# Patient Record
Sex: Female | Born: 1965 | Race: White | Hispanic: No | Smoking: Former smoker
Health system: Southern US, Community
[De-identification: ages and names within clinical notes are randomized; demographics above are authoritative.]

## PROBLEM LIST (undated history)

## (undated) DIAGNOSIS — I10 Essential (primary) hypertension: Secondary | ICD-10-CM

## (undated) DIAGNOSIS — D649 Anemia, unspecified: Secondary | ICD-10-CM

## (undated) DIAGNOSIS — H543 Unqualified visual loss, both eyes: Secondary | ICD-10-CM

## (undated) DIAGNOSIS — T7840XA Allergy, unspecified, initial encounter: Secondary | ICD-10-CM

## (undated) HISTORY — DX: Allergy, unspecified, initial encounter: T78.40XA

## (undated) HISTORY — DX: Anemia, unspecified: D64.9

## (undated) HISTORY — DX: Unqualified visual loss, both eyes: H54.3

## (undated) HISTORY — DX: Essential (primary) hypertension: I10

---

## 2005-09-27 ENCOUNTER — Emergency Department (HOSPITAL_COMMUNITY): Admission: EM | Admit: 2005-09-27 | Discharge: 2005-09-27 | Payer: Self-pay | Admitting: Emergency Medicine

## 2005-11-27 ENCOUNTER — Encounter: Admission: RE | Admit: 2005-11-27 | Discharge: 2005-11-27 | Payer: Self-pay | Admitting: Internal Medicine

## 2008-06-24 ENCOUNTER — Encounter: Admission: RE | Admit: 2008-06-24 | Discharge: 2008-06-24 | Payer: Self-pay | Admitting: Internal Medicine

## 2010-09-30 ENCOUNTER — Encounter: Payer: Self-pay | Admitting: Internal Medicine

## 2011-06-19 ENCOUNTER — Other Ambulatory Visit: Payer: Self-pay | Admitting: Internal Medicine

## 2011-06-19 DIAGNOSIS — Z1231 Encounter for screening mammogram for malignant neoplasm of breast: Secondary | ICD-10-CM

## 2011-07-02 ENCOUNTER — Ambulatory Visit
Admission: RE | Admit: 2011-07-02 | Discharge: 2011-07-02 | Disposition: A | Discharge status: Home / Self Care | Payer: Medicare Other | Source: Ambulatory Visit | Attending: Internal Medicine | Admitting: Internal Medicine

## 2011-07-02 DIAGNOSIS — Z1231 Encounter for screening mammogram for malignant neoplasm of breast: Secondary | ICD-10-CM

## 2012-10-25 IMAGING — MG MM DIGITAL SCREENING BILAT
5 series · 5 of 5 positions shown · non-contrast
Comparison: Prior studies.

DG SCREEN MAMMOGRAM BILATERAL
Bilateral CC and MLO view(s) were taken.

DIGITAL SCREENING MAMMOGRAM WITH CAD:

[R CC]
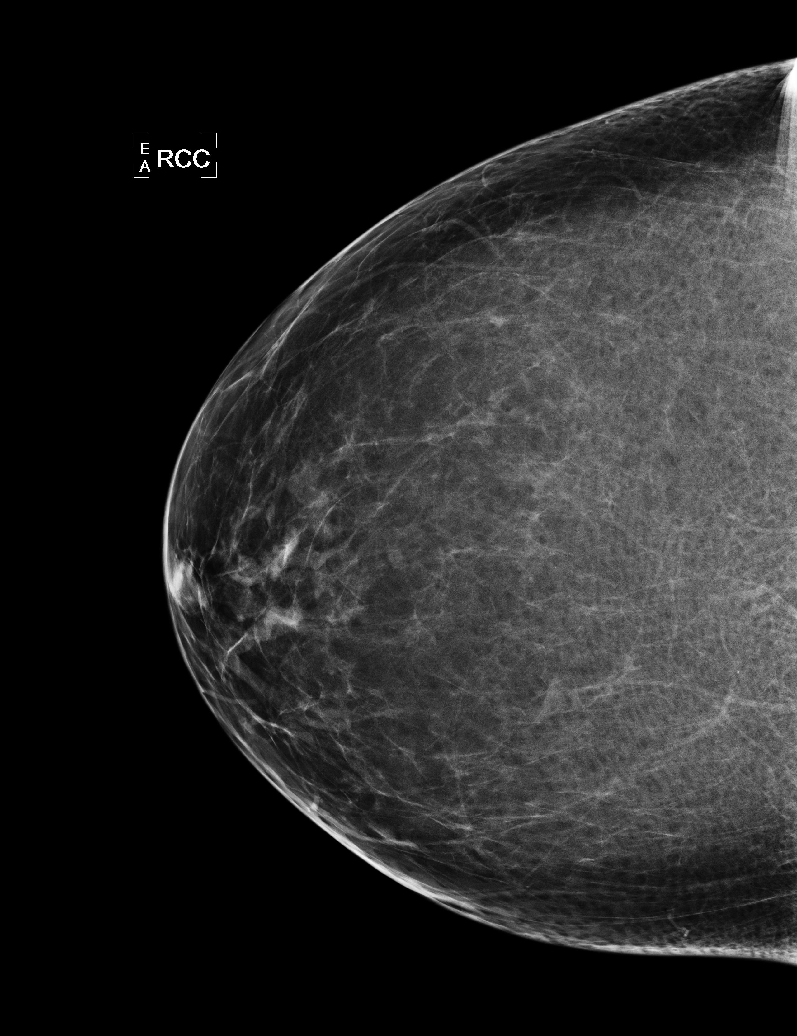

[L CC]
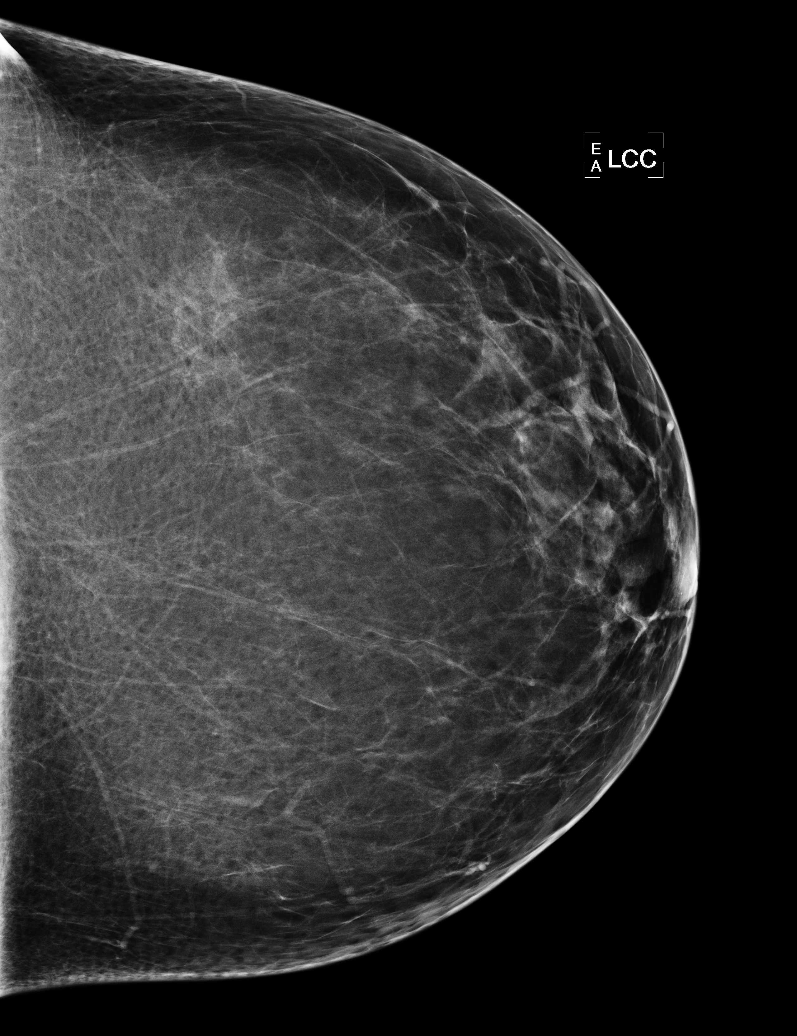

[L MLO]
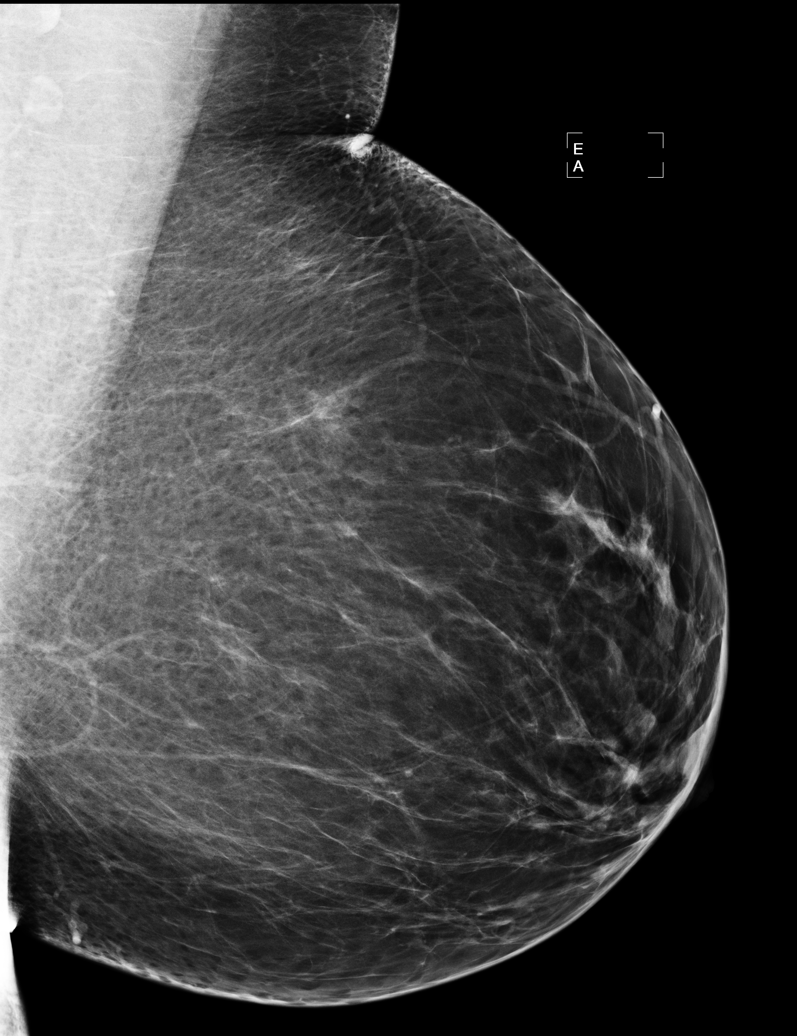

[R MLO (1 of 2)]
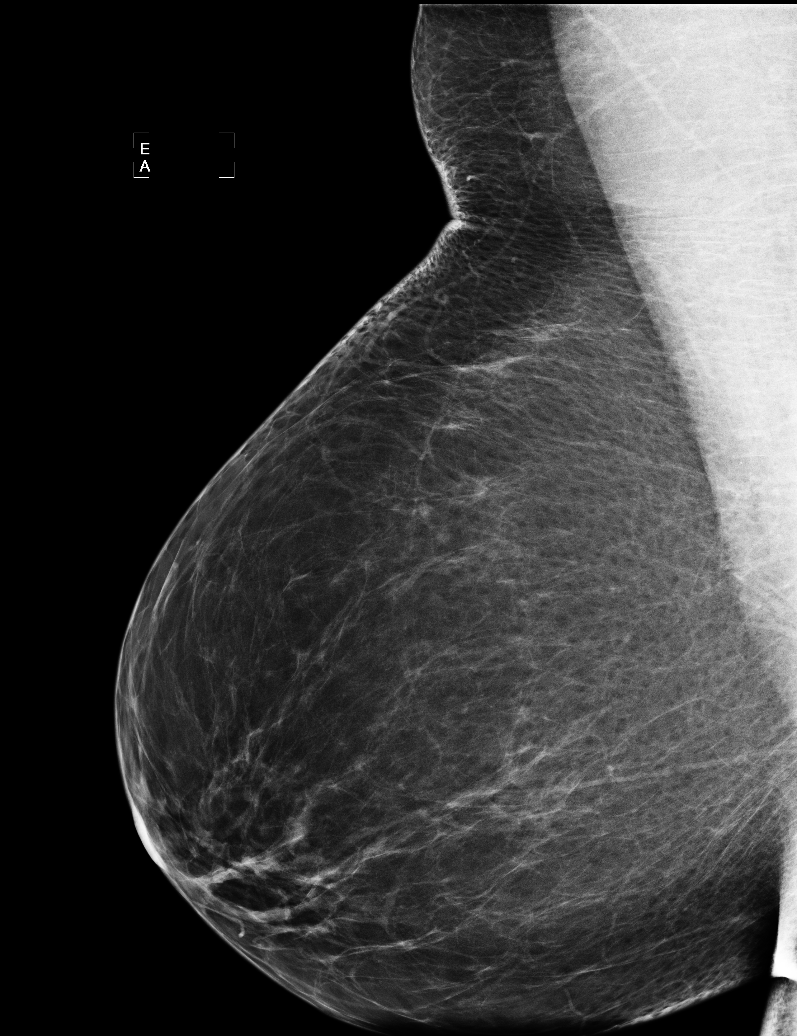

[R MLO (2 of 2)]
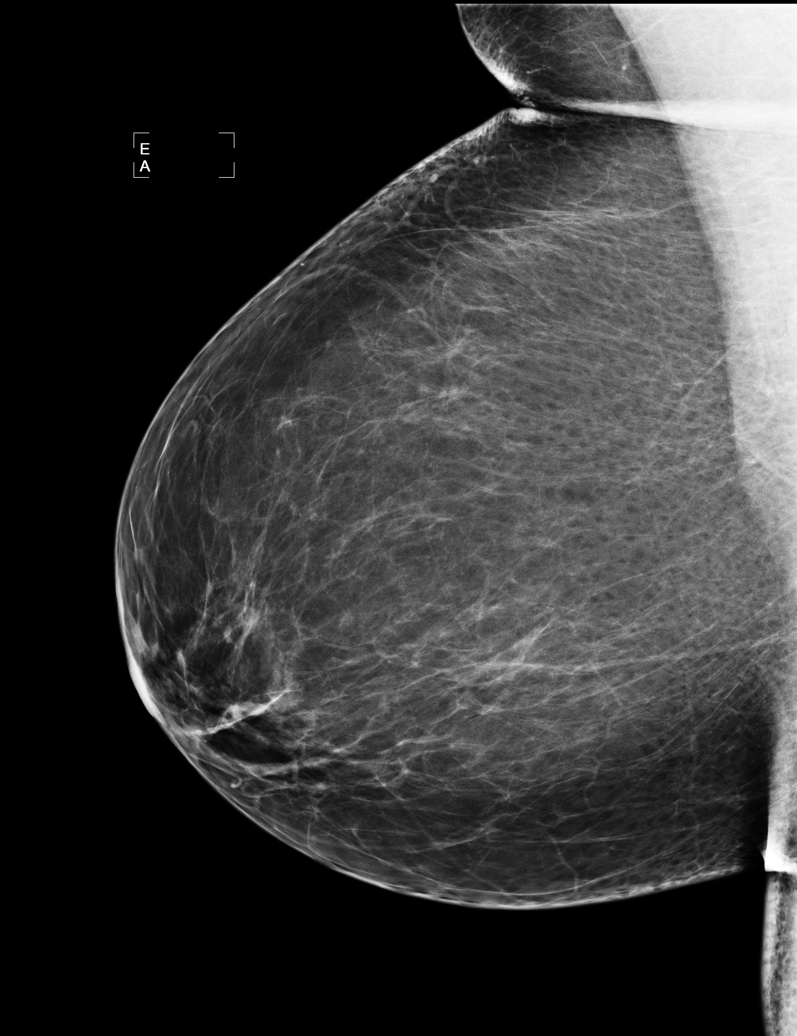

[5 of 5 positions shown; findings below may reference images not displayed]

There are scattered fibroglandular densities.  There is no dominant mass, architectural distortion 
or calcification to suggest malignancy.

Images were processed with CAD.
IMPRESSION: No mammographic evidence of malignancy.  Suggest yearly screening mammography.

A result letter of this screening mammogram will be mailed directly to the patient.

ASSESSMENT: Negative - BI-RADS 1

Screening mammogram in 1 year.
,

## 2013-05-20 ENCOUNTER — Other Ambulatory Visit: Payer: Self-pay | Admitting: Internal Medicine

## 2013-05-20 DIAGNOSIS — Z1231 Encounter for screening mammogram for malignant neoplasm of breast: Secondary | ICD-10-CM

## 2013-06-15 ENCOUNTER — Ambulatory Visit
Admission: RE | Admit: 2013-06-15 | Discharge: 2013-06-15 | Disposition: A | Discharge status: Home / Self Care | Payer: Medicare HMO | Source: Ambulatory Visit | Attending: Internal Medicine | Admitting: Internal Medicine

## 2013-06-15 DIAGNOSIS — Z1231 Encounter for screening mammogram for malignant neoplasm of breast: Secondary | ICD-10-CM

## 2014-10-08 IMAGING — MG MM DIGITAL SCREENING BILAT
4 series · 4 of 4 positions shown · non-contrast
Comparison: Previous exam(s).

CLINICAL DATA: Screening.

DIGITAL SCREENING BILATERAL MAMMOGRAM WITH CAD

[R CC]
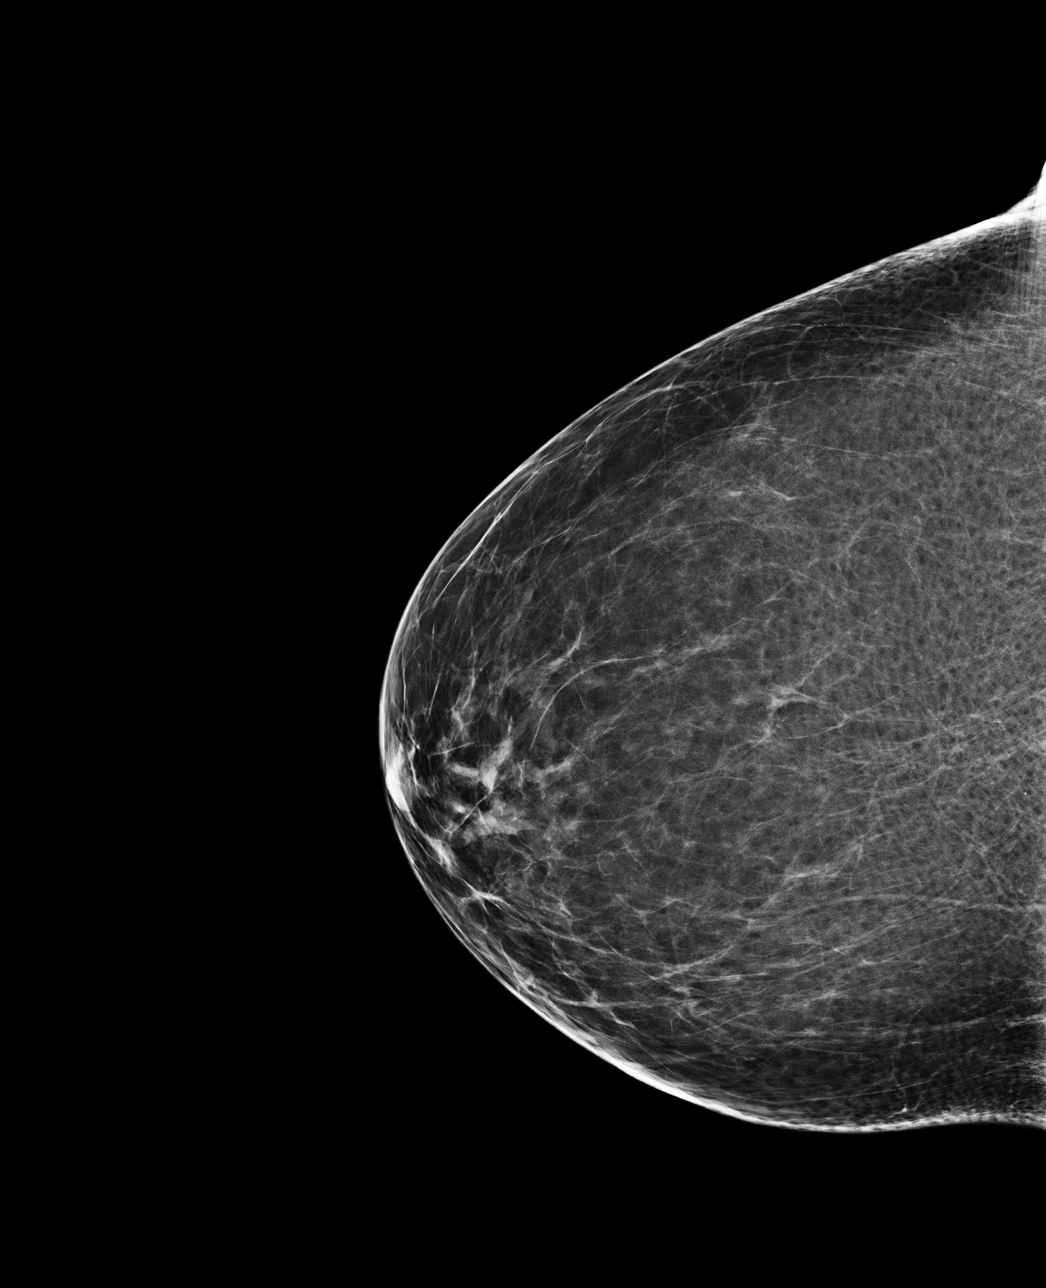

[L CC]
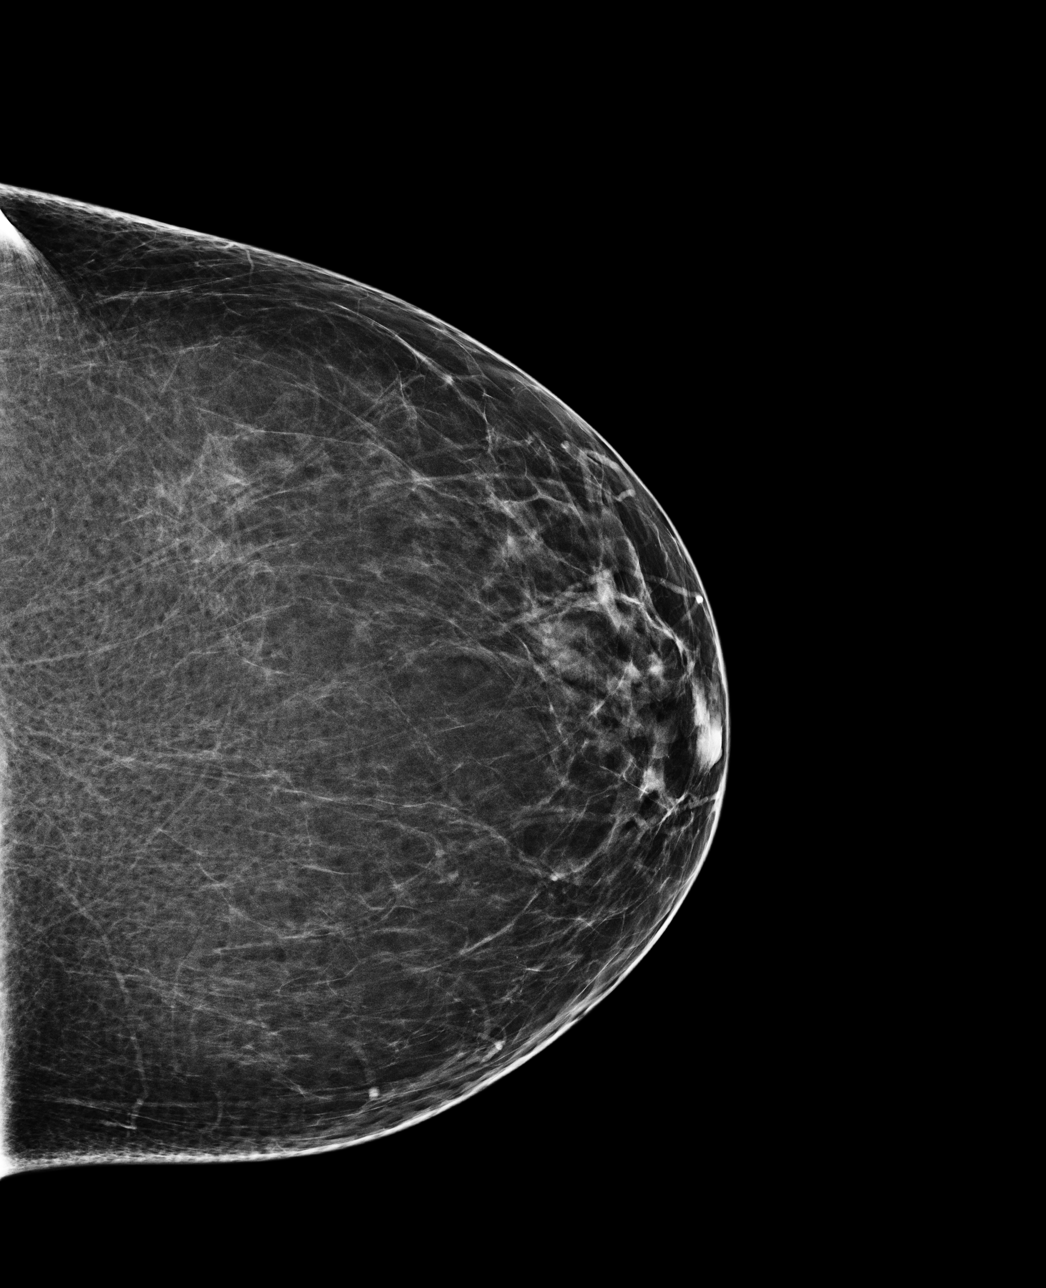

[R MLO]
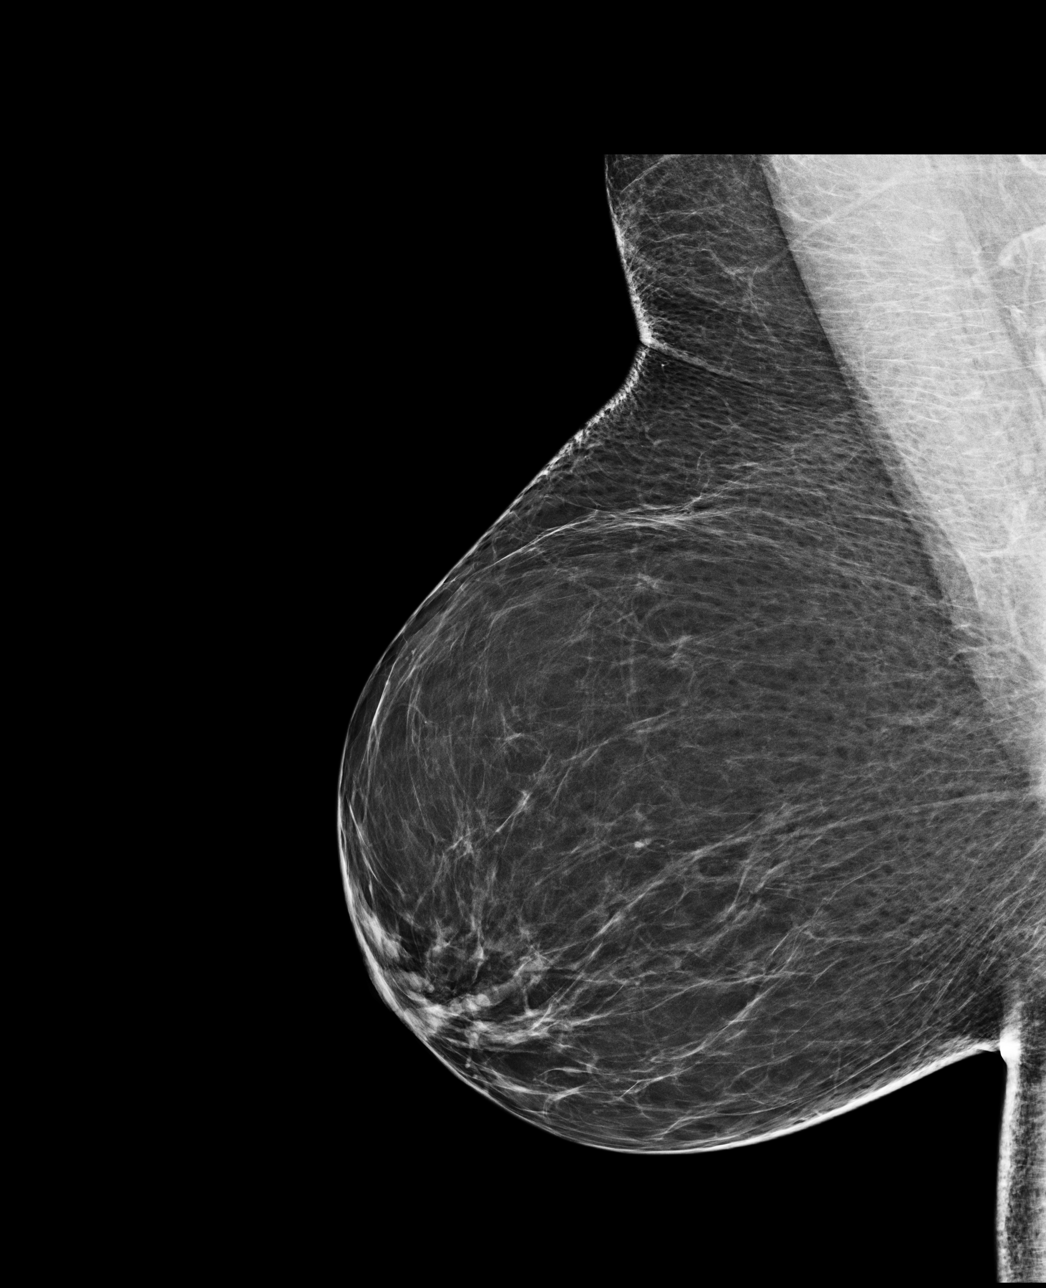

[L MLO]
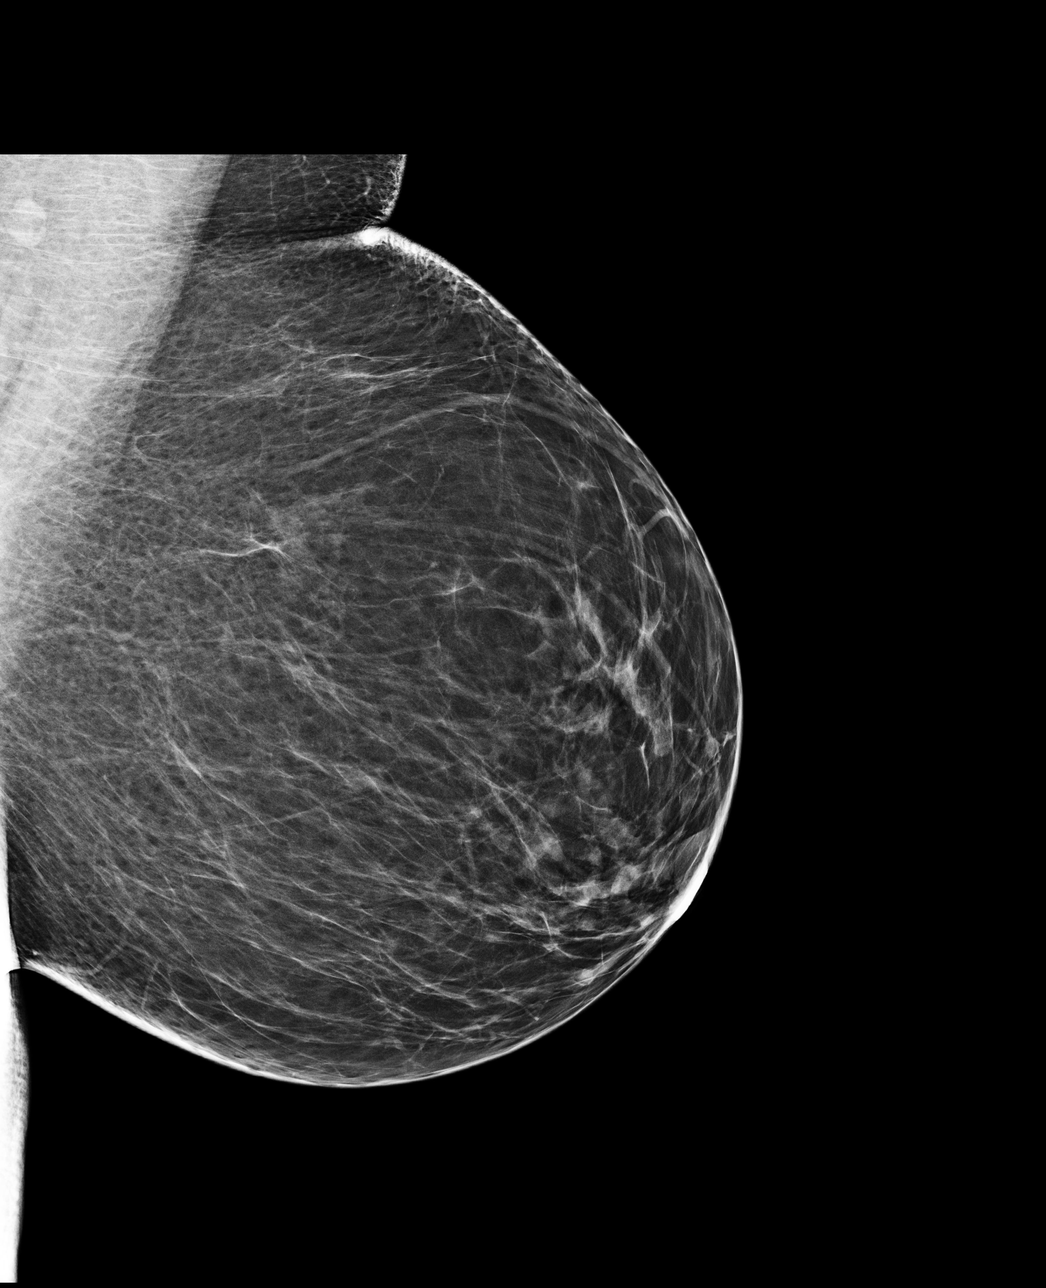

[4 of 4 positions shown; findings below may reference images not displayed]

FINDINGS: ACR Breast Density Category a:  The breast tissue is almost
entirely fatty.

There are no findings suspicious for malignancy.

Images were processed with CAD.
IMPRESSION: No mammographic evidence of malignancy.

A result letter of this screening mammogram will be mailed directly
to the patient.

RECOMMENDATION:
Screening mammogram in one year. (Code:H9-4-W6G)

BI-RADS CATEGORY 1:  Negative.

## 2014-11-01 DIAGNOSIS — Z1211 Encounter for screening for malignant neoplasm of colon: Secondary | ICD-10-CM | POA: Diagnosis not present

## 2014-11-01 DIAGNOSIS — N950 Postmenopausal bleeding: Secondary | ICD-10-CM | POA: Diagnosis not present

## 2014-11-01 DIAGNOSIS — Z01419 Encounter for gynecological examination (general) (routine) without abnormal findings: Secondary | ICD-10-CM | POA: Diagnosis not present

## 2014-11-01 DIAGNOSIS — Z1231 Encounter for screening mammogram for malignant neoplasm of breast: Secondary | ICD-10-CM | POA: Diagnosis not present

## 2014-11-24 DIAGNOSIS — N950 Postmenopausal bleeding: Secondary | ICD-10-CM | POA: Diagnosis not present

## 2014-11-24 DIAGNOSIS — N840 Polyp of corpus uteri: Secondary | ICD-10-CM | POA: Diagnosis not present

## 2014-12-01 ENCOUNTER — Encounter: Payer: Self-pay | Admitting: *Deleted

## 2014-12-12 DIAGNOSIS — N840 Polyp of corpus uteri: Secondary | ICD-10-CM | POA: Diagnosis not present

## 2014-12-12 DIAGNOSIS — N950 Postmenopausal bleeding: Secondary | ICD-10-CM | POA: Diagnosis not present

## 2014-12-27 ENCOUNTER — Ambulatory Visit
Admit: 2014-12-27 | Disposition: A | Discharge status: Home / Self Care | Payer: Self-pay | Attending: Anesthesiology | Admitting: Anesthesiology

## 2014-12-27 DIAGNOSIS — N840 Polyp of corpus uteri: Secondary | ICD-10-CM | POA: Diagnosis not present

## 2014-12-27 DIAGNOSIS — Z87891 Personal history of nicotine dependence: Secondary | ICD-10-CM | POA: Diagnosis not present

## 2014-12-27 DIAGNOSIS — Z01812 Encounter for preprocedural laboratory examination: Secondary | ICD-10-CM | POA: Diagnosis not present

## 2014-12-27 DIAGNOSIS — I10 Essential (primary) hypertension: Secondary | ICD-10-CM | POA: Diagnosis not present

## 2014-12-27 DIAGNOSIS — Z79899 Other long term (current) drug therapy: Secondary | ICD-10-CM | POA: Diagnosis not present

## 2014-12-27 DIAGNOSIS — K219 Gastro-esophageal reflux disease without esophagitis: Secondary | ICD-10-CM | POA: Diagnosis not present

## 2014-12-27 DIAGNOSIS — Z01810 Encounter for preprocedural cardiovascular examination: Secondary | ICD-10-CM | POA: Diagnosis not present

## 2014-12-27 DIAGNOSIS — N950 Postmenopausal bleeding: Secondary | ICD-10-CM | POA: Diagnosis not present

## 2014-12-28 LAB — CBC
HCT: 37.4 % (ref 35.0–47.0)
HGB: 12.6 g/dL (ref 12.0–16.0)
MCH: 29.9 pg (ref 26.0–34.0)
MCHC: 33.6 g/dL (ref 32.0–36.0)
MCV: 89 fL (ref 80–100)
Platelet: 213 10*3/uL (ref 150–440)
RBC: 4.2 10*6/uL (ref 3.80–5.20)
RDW: 13.5 % (ref 11.5–14.5)
WBC: 6.1 10*3/uL (ref 3.6–11.0)

## 2014-12-28 LAB — PROTIME-INR
INR: 0.9
PROTHROMBIN TIME: 12.7 s

## 2014-12-30 ENCOUNTER — Ambulatory Visit
Admit: 2014-12-30 | Disposition: A | Discharge status: Home / Self Care | Payer: Self-pay | Attending: Obstetrics & Gynecology | Admitting: Obstetrics & Gynecology

## 2014-12-30 DIAGNOSIS — I10 Essential (primary) hypertension: Secondary | ICD-10-CM | POA: Diagnosis not present

## 2014-12-30 DIAGNOSIS — H548 Legal blindness, as defined in USA: Secondary | ICD-10-CM | POA: Diagnosis not present

## 2014-12-30 DIAGNOSIS — Z87891 Personal history of nicotine dependence: Secondary | ICD-10-CM | POA: Diagnosis not present

## 2014-12-30 DIAGNOSIS — N950 Postmenopausal bleeding: Secondary | ICD-10-CM | POA: Diagnosis not present

## 2014-12-30 DIAGNOSIS — K219 Gastro-esophageal reflux disease without esophagitis: Secondary | ICD-10-CM | POA: Diagnosis not present

## 2014-12-30 DIAGNOSIS — N840 Polyp of corpus uteri: Secondary | ICD-10-CM | POA: Diagnosis not present

## 2014-12-30 DIAGNOSIS — Z791 Long term (current) use of non-steroidal anti-inflammatories (NSAID): Secondary | ICD-10-CM | POA: Diagnosis not present

## 2014-12-30 DIAGNOSIS — Z79899 Other long term (current) drug therapy: Secondary | ICD-10-CM | POA: Diagnosis not present

## 2014-12-30 DIAGNOSIS — N841 Polyp of cervix uteri: Secondary | ICD-10-CM | POA: Diagnosis not present

## 2014-12-30 DIAGNOSIS — Z87442 Personal history of urinary calculi: Secondary | ICD-10-CM | POA: Diagnosis not present

## 2015-01-02 LAB — SURGICAL PATHOLOGY

## 2015-01-08 NOTE — Op Note (Signed)
PATIENT NAME:  Carolyn Noble, Carolyn Noble MR#:  [id number] DATE OF BIRTH:  04-30-1966  DATE OF PROCEDURE:  12/30/2014  PREOPERATIVE DIAGNOSIS: Postmenopausal bleeding and endometrial polyp.   POSTOPERATIVE DIAGNOSIS: Postmenopausal bleeding and endometrial polyp.   PROCEDURE PERFORMED: Hysteroscopy, dilation and curettage procedure with polypectomy.   SURGEON: Dierdre Searles. Paul Harris, M.D.   ASSISTANT: None.   ANESTHESIA: General.   ESTIMATED BLOOD LOSS: Minimal.   COMPLICATIONS: None.   FINDINGS: Polyp at the fundus of the uterus. No other abnormalities.   DISPOSITION: To the recovery room in stable condition.   TECHNIQUE: The patient is prepped and draped in the usual sterile fashion, after adequate anesthesia is obtained, in the dorsal lithotomy position. Bladder is drained with Noble Robinson catheter. Speculum is placed and the anterior lip of the cervix is grasped with Noble tenaculum. Endocervical curettage is performed with Noble Kevorkian curette. The uterus is sounded to 9 cm. Cervix is dilated to Noble size 7 Hegar dilator. Noble 30 degree hysteroscope is inserted with fluid distention of the intrauterine cavity with the use of lactated Ringer solution, and the above-mentioned findings are visualized. Using the MyoSure device, the polyp is completely excised to Noble clean base was minimal bleeding noted. No other lesions are noted. Hysteroscope is removed with minimal discrepancy of fluid. The cervix is nonbleeding after the tenaculum is removed. The patient goes to the recovery room in stable condition. All sponge, instrument, and needle counts are correct.  ____________________________ R. Annamarie MajorPaul Harris, MD rph:sb D: 12/30/2014 09:30:51 ET T: 12/30/2014 10:41:43 ET JOB#: [id number]  cc: Dierdre Searles. Paul Harris, MD, <Dictator> Nadara MustardOBERT P HARRIS MD ELECTRONICALLY SIGNED 12/30/2014 13:55

## 2015-01-09 ENCOUNTER — Encounter: Payer: Self-pay | Admitting: Family Medicine

## 2015-01-09 ENCOUNTER — Ambulatory Visit (INDEPENDENT_AMBULATORY_CARE_PROVIDER_SITE_OTHER): Payer: Commercial Managed Care - HMO | Admitting: Family Medicine

## 2015-01-09 VITALS — BP 122/78 | HR 78 | Temp 97.6°F | Resp 16 | Ht 62.5 in | Wt 187.0 lb

## 2015-01-09 DIAGNOSIS — I10 Essential (primary) hypertension: Secondary | ICD-10-CM | POA: Diagnosis not present

## 2015-01-09 DIAGNOSIS — M7072 Other bursitis of hip, left hip: Secondary | ICD-10-CM

## 2015-01-09 DIAGNOSIS — Z7189 Other specified counseling: Secondary | ICD-10-CM | POA: Diagnosis not present

## 2015-01-09 DIAGNOSIS — Z7689 Persons encountering health services in other specified circumstances: Secondary | ICD-10-CM

## 2015-01-09 LAB — CBC WITH DIFFERENTIAL/PLATELET
BASOS PCT: 1 % (ref 0–1)
Basophils Absolute: 0.1 10*3/uL (ref 0.0–0.1)
EOS PCT: 2 % (ref 0–5)
Eosinophils Absolute: 0.1 10*3/uL (ref 0.0–0.7)
HCT: 38 % (ref 36.0–46.0)
HEMOGLOBIN: 12.7 g/dL (ref 12.0–15.0)
Lymphocytes Relative: 26 % (ref 12–46)
Lymphs Abs: 1.5 10*3/uL (ref 0.7–4.0)
MCH: 29.1 pg (ref 26.0–34.0)
MCHC: 33.4 g/dL (ref 30.0–36.0)
MCV: 87 fL (ref 78.0–100.0)
MPV: 10.6 fL (ref 8.6–12.4)
Monocytes Absolute: 0.6 10*3/uL (ref 0.1–1.0)
Monocytes Relative: 11 % (ref 3–12)
NEUTROS PCT: 60 % (ref 43–77)
Neutro Abs: 3.5 10*3/uL (ref 1.7–7.7)
Platelets: 215 10*3/uL (ref 150–400)
RBC: 4.37 MIL/uL (ref 3.87–5.11)
RDW: 13.7 % (ref 11.5–15.5)
WBC: 5.8 10*3/uL (ref 4.0–10.5)

## 2015-01-09 LAB — LIPID PANEL
Cholesterol: 201 mg/dL — ABNORMAL HIGH (ref 0–200)
HDL: 51 mg/dL (ref >=46)
LDL Cholesterol: 120 mg/dL — ABNORMAL HIGH (ref 0–99)
TRIGLYCERIDES: 152 mg/dL — AB (ref <150)
Total CHOL/HDL Ratio: 3.9 Ratio
VLDL: 30 mg/dL (ref 0–40)

## 2015-01-09 LAB — COMPLETE METABOLIC PANEL WITH GFR
ALT: 19 U/L (ref 0–35)
AST: 14 U/L (ref 0–37)
Albumin: 4 g/dL (ref 3.5–5.2)
Alkaline Phosphatase: 53 U/L (ref 39–117)
BUN: 16 mg/dL (ref 6–23)
CALCIUM: 9.1 mg/dL (ref 8.4–10.5)
CO2: 21 mEq/L (ref 19–32)
Chloride: 108 mEq/L (ref 96–112)
Creat: 0.88 mg/dL (ref 0.50–1.10)
GFR, EST AFRICAN AMERICAN: 88 mL/min
GFR, Est Non African American: 76 mL/min
Glucose, Bld: 89 mg/dL (ref 70–99)
Potassium: 4.1 mEq/L (ref 3.5–5.3)
Sodium: 141 mEq/L (ref 135–145)
Total Bilirubin: 0.4 mg/dL (ref 0.2–1.2)
Total Protein: 6.5 g/dL (ref 6.0–8.3)

## 2015-01-09 NOTE — Progress Notes (Signed)
Subjective:    Patient ID: Carolyn Noble, female    DOB: 04/23/1966, 49 y.o.   MRN: [id number]  HPI Patient is here today to establish care. She sees a gynecologist. Her mammogram and Pap smear up-to-date. She recently underwent surgery to remove a polyp from her uterus due to postmenopausal bleeding. The pathology is still pending. She also complains of pain in her left lateral hip over the greater trochanter. She is tender to palpation in this area. She has IT band pain. It aches and throbs to sleep on that hip at night. She denies any injury. She is due for colonoscopy. 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 Anemia   . Allergy     SEASONAL  . Hypertension    No past surgical history on file. Current Outpatient Prescriptions on File Prior to Visit  Medication Sig Dispense Refill  . cholecalciferol (VITAMIN D) 1000 UNITS tablet Take 1,000 Units by mouth daily.    Marland Kitchen. lisinopril (PRINIVIL,ZESTRIL) 5 MG tablet Take 5 mg by mouth daily.    Marland Kitchen. loratadine (CLARITIN) 10 MG tablet Take 10 mg by mouth daily.    . meloxicam (MOBIC) 15 MG tablet Take 15 mg by mouth daily.    . potassium chloride (K-DUR,KLOR-CON) 10 MEQ tablet Take 10 mEq by mouth 2 (two) times daily.    . vitamin B-12 (CYANOCOBALAMIN) 100 MCG tablet Take 100 mcg by mouth daily.    Marland Kitchen. zinc sulfate 220 MG capsule Take 220 mg by mouth daily.     No current facility-administered medications on file prior to visit.   Allergies  Allergen Reactions  . Hydrocodone     INTOLERANCE- UPSET STOMACH   History   Social History  . Marital Status: Divorced    Spouse Name: N/A  . Number of Children: N/A  . Years of Education: N/A   Occupational History  . Not on file.   Social History Main Topics  . Smoking status: Never Smoker   . Smokeless tobacco: Never Used  . Alcohol Use: No  . Drug Use: No  . Sexual Activity: Yes    Birth  Control/ Protection: None   Other Topics Concern  . Not on file   Social History Narrative  . No narrative on file   Family History  Problem Relation Age of Onset  . Arthritis Mother   . COPD Mother   . Diabetes Mother   . Diabetes Father   . Heart disease Father   . Vision loss Father   . Heart disease Sister   . Hypertension Sister   . Vision loss Sister   . Hypertension Brother   . Cancer Maternal Grandmother   . Diabetes Maternal Grandmother   . Diabetes Maternal Grandfather   . Cancer Paternal Grandmother   . Diabetes Paternal Grandmother   . Diabetes Paternal Grandfather      Review of Systems  All other systems reviewed and are negative.      Objective:   Physical Exam  Constitutional: She is oriented to person, place, and time. She appears well-developed and well-nourished. No distress.  HENT:  Head: Normocephalic and atraumatic.  Right Ear: External ear normal.  Left Ear: External ear normal.  Nose: Nose normal.  Mouth/Throat: Oropharynx is clear and moist. No oropharyngeal exudate.  Eyes: Conjunctivae and EOM are normal. Pupils are equal, round, and reactive to light. Right eye exhibits  no discharge. Left eye exhibits no discharge. No scleral icterus.  Neck: Normal range of motion. Neck supple. No JVD present. No tracheal deviation present. No thyromegaly present.  Cardiovascular: Normal rate, regular rhythm, normal heart sounds and intact distal pulses.  Exam reveals no gallop and no friction rub.   No murmur heard. Pulmonary/Chest: Effort normal and breath sounds normal. No stridor. No respiratory distress. She has no wheezes. She has no rales. She exhibits no tenderness.  Abdominal: Soft. Bowel sounds are normal. She exhibits no distension and no mass. There is no tenderness. There is no rebound and no guarding.  Musculoskeletal: Normal range of motion. She exhibits no edema or tenderness.  Lymphadenopathy:    She has no cervical adenopathy.    Neurological: She is alert and oriented to person, place, and time. She has normal reflexes. She displays normal reflexes. No cranial nerve deficit. She exhibits normal muscle tone. Coordination normal.  Skin: Skin is warm. No rash noted. She is not diaphoretic. No erythema. No pallor.  Psychiatric: She has a normal mood and affect. Her behavior is normal. Judgment and thought content normal.  Vitals reviewed.         Assessment & Plan:  Establishing care with new doctor, encounter for  Benign essential HTN - Plan: COMPLETE METABOLIC PANEL WITH GFR, CBC with Differential/Platelet, Lipid panel  Hip bursitis, left - Plan: DG HIP UNILAT WITH PELVIS 2-3 VIEWS LEFT  I recommended a colonoscopy and I will gladly schedule that whenever the patient wishes. Her blood pressure is excellent. I will check a CMP, fasting lipid panel, and CBC. I have also recommended an x-ray of the left hip. I recommended meloxicam 15 mg by mouth daily for bursitis and if no better consider a cortisone injection in the left hip. Otherwise preventative care is up-to-date.

## 2015-01-11 ENCOUNTER — Encounter: Payer: Self-pay | Admitting: Family Medicine

## 2015-01-31 ENCOUNTER — Encounter: Payer: Self-pay | Admitting: *Deleted

## 2015-02-09 ENCOUNTER — Other Ambulatory Visit: Payer: Self-pay | Admitting: Family Medicine

## 2015-02-09 NOTE — Telephone Encounter (Signed)
Refill denied.   Patient not under MD.

## 2015-03-01 ENCOUNTER — Other Ambulatory Visit: Payer: Self-pay | Admitting: Family Medicine

## 2015-03-01 NOTE — Telephone Encounter (Signed)
Medication refilled per protocol. 

## 2015-06-30 ENCOUNTER — Ambulatory Visit (INDEPENDENT_AMBULATORY_CARE_PROVIDER_SITE_OTHER): Payer: Commercial Managed Care - HMO | Admitting: Family Medicine

## 2015-06-30 ENCOUNTER — Encounter: Payer: Self-pay | Admitting: Family Medicine

## 2015-06-30 VITALS — BP 110/76 | HR 74 | Temp 98.1°F | Resp 20 | Wt 187.5 lb

## 2015-06-30 DIAGNOSIS — M25562 Pain in left knee: Secondary | ICD-10-CM | POA: Diagnosis not present

## 2015-06-30 MED ORDER — KETOROLAC TROMETHAMINE 10 MG PO TABS: 10.0000 mg | ORAL_TABLET | Freq: Four times a day (QID) | ORAL | Status: DC | PRN

## 2015-06-30 NOTE — Progress Notes (Signed)
Subjective:    Patient ID: Carolyn Noble, female    DOB: 10/09/1965, 49 y.o.   MRN: [id number]  HPI 01/09/15 Patient is here today to establish care. She sees a gynecologist. Her mammogram and Pap smear up-to-date. She recently underwent surgery to remove a polyp from her uterus due to postmenopausal bleeding. The pathology is still pending. She also complains of pain in her left lateral hip over the greater trochanter. She is tender to palpation in this area. She has IT band pain. It aches and throbs to sleep on that hip at night. She denies any injury. She is due for colonoscopy. 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  At that time, my plan was: I recommended a colonoscopy and I will gladly schedule that whenever the patient wishes. Her blood pressure is excellent. I will check a CMP, fasting lipid panel, and CBC. I have also recommended an x-ray of the left hip. I recommended meloxicam 15 mg by mouth daily for bursitis and if no better consider a cortisone injection in the left hip. Otherwise preventative care is up-to-date.  06/30/15 After jumping on a trampoline last weekend, the patient developed pain in stiffness in her left knee. She now has a moderate knee effusion and also a collection of fluid in the posterior aspect of her knee which feels like a Baker's cyst. She also has pain in stiffness with flexion of the knee greater than 90. Past Medical History  Diagnosis Date  . Anemia   . Allergy     SEASONAL  . Hypertension   . Blind in both eyes     Stargartz   No past surgical history on file. Current Outpatient Prescriptions on File Prior to Visit  Medication Sig Dispense Refill  . cholecalciferol (VITAMIN D) 1000 UNITS tablet Take 1,000 Units by mouth daily.    Marland Kitchen. lisinopril (PRINIVIL,ZESTRIL) 5 MG tablet TAKE 1 TABLET BY MOUTH EVERY DAY 90 tablet 3  . loratadine (CLARITIN) 10 MG tablet Take 10 mg by mouth daily.    .  meloxicam (MOBIC) 15 MG tablet Take 15 mg by mouth daily.    Marland Kitchen. oxyCODONE-acetaminophen (PERCOCET/ROXICET) 5-325 MG per tablet 1 tablet every 6 (six) hours as needed.   0  . potassium chloride (K-DUR,KLOR-CON) 10 MEQ tablet Take 10 mEq by mouth 2 (two) times daily.    . vitamin B-12 (CYANOCOBALAMIN) 100 MCG tablet Take 100 mcg by mouth daily.    Marland Kitchen. zinc sulfate 220 MG capsule Take 220 mg by mouth daily.     No current facility-administered medications on file prior to visit.   Allergies  Allergen Reactions  . Hydrocodone     INTOLERANCE- UPSET STOMACH   Social History   Social History  . Marital Status: Divorced    Spouse Name: N/A  . Number of Children: N/A  . Years of Education: N/A   Occupational History  . Not on file.   Social History Main Topics  . Smoking status: Former Smoker    Quit date: 01/08/2005  . Smokeless tobacco: Never Used  . Alcohol Use: No  . Drug Use: No  . Sexual Activity: Yes    Birth Control/ Protection: None   Other Topics Concern  . Not on file   Social History Narrative   Family History  Problem Relation Age of Onset  . Arthritis Mother   . COPD Mother   . Diabetes Mother   . Diabetes  Father   . Heart disease Father   . Vision loss Father   . Heart disease Sister   . Hypertension Sister   . Vision loss Sister   . Hypertension Brother   . Cancer Maternal Grandmother   . Diabetes Maternal Grandmother   . Diabetes Maternal Grandfather   . Cancer Paternal Grandmother   . Diabetes Paternal Grandmother   . Diabetes Paternal Grandfather      Review of Systems  All other systems reviewed and are negative.      Objective:   Physical Exam  Constitutional: She appears well-developed and well-nourished. No distress.  Eyes: Left eye exhibits no discharge.  Cardiovascular: Normal rate, regular rhythm and normal heart sounds.  Exam reveals no gallop and no friction rub.   No murmur heard. Pulmonary/Chest: Effort normal and breath sounds  normal. No respiratory distress. She has no wheezes. She has no rales.  Musculoskeletal: She exhibits no edema.       Left knee: She exhibits decreased range of motion, swelling, effusion and abnormal meniscus. She exhibits no erythema, no LCL laxity, normal patellar mobility and no MCL laxity. No medial joint line, no lateral joint line, no MCL and no LCL tenderness noted.  Skin: She is not diaphoretic.  Vitals reviewed.         Assessment & Plan:  Left knee pain - Plan: ketorolac (TORADOL) 10 MG tablet  I suspect the patient has a Baker's cyst secondary to a meniscal tear. I recommended rest ice elevation and compression. I've also recommended Toradol 10 mg every 6 hours for the next 4 days. Recheck next week if no better. I would next proceed with cortisone injection into the left knee versus an MRI

## 2015-07-06 ENCOUNTER — Ambulatory Visit (INDEPENDENT_AMBULATORY_CARE_PROVIDER_SITE_OTHER): Payer: Commercial Managed Care - HMO | Admitting: Family Medicine

## 2015-07-06 ENCOUNTER — Encounter: Payer: Self-pay | Admitting: Family Medicine

## 2015-07-06 VITALS — BP 150/90 | HR 60 | Temp 97.7°F | Resp 18 | Wt 191.0 lb

## 2015-07-06 DIAGNOSIS — M25562 Pain in left knee: Secondary | ICD-10-CM | POA: Diagnosis not present

## 2015-07-06 NOTE — Progress Notes (Signed)
Subjective:    Patient ID: Carolyn Noble, female    DOB: 09/29/1965, 49 y.o.   MRN: [id number]  HPI   06/30/15 After jumping on a trampoline last weekend, the patient developed pain in stiffness in her left knee. She now has a moderate knee effusion and also a collection of fluid in the posterior aspect of her knee which feels like a Baker's cyst. She also has pain in stiffness with flexion of the knee greater than 90.  At that time, my plan was: I suspect the patient has a Baker's cyst secondary to a meniscal tear. I recommended rest ice elevation and compression. I've also recommended Toradol 10 mg every 6 hours for the next 4 days. Recheck next week if no better. I would next proceed with cortisone injection into the left knee versus an MRI  07/06/15 She is here today for follow-up.  Pain is much better. There is marked improvement in the effusion in her knee joint. I can no longer palpate a Baker's cyst in the posterior aspect of her popliteal fossa. Range of motion has improved. She states she is only having minimal pain. Past Medical History  Diagnosis Date  . Anemia   . Allergy     SEASONAL  . Hypertension   . Blind in both eyes     Stargartz   No past surgical history on file. Current Outpatient Prescriptions on File Prior to Visit  Medication Sig Dispense Refill  . cholecalciferol (VITAMIN D) 1000 UNITS tablet Take 1,000 Units by mouth daily.    Marland Kitchen. ketorolac (TORADOL) 10 MG tablet Take 1 tablet (10 mg total) by mouth every 6 (six) hours as needed. 20 tablet 0  . lisinopril (PRINIVIL,ZESTRIL) 5 MG tablet TAKE 1 TABLET BY MOUTH EVERY DAY 90 tablet 3  . loratadine (CLARITIN) 10 MG tablet Take 10 mg by mouth daily.    . meloxicam (MOBIC) 15 MG tablet Take 15 mg by mouth daily.    Marland Kitchen. oxyCODONE-acetaminophen (PERCOCET/ROXICET) 5-325 MG per tablet 1 tablet every 6 (six) hours as needed.   0  . potassium chloride (K-DUR,KLOR-CON) 10 MEQ tablet Take 10 mEq by mouth 2 (two) times  daily.    . vitamin B-12 (CYANOCOBALAMIN) 100 MCG tablet Take 100 mcg by mouth daily.    Marland Kitchen. zinc sulfate 220 MG capsule Take 220 mg by mouth daily.     No current facility-administered medications on file prior to visit.   Allergies  Allergen Reactions  . Hydrocodone     INTOLERANCE- UPSET STOMACH   Social History   Social History  . Marital Status: Divorced    Spouse Name: N/A  . Number of Children: N/A  . Years of Education: N/A   Occupational History  . Not on file.   Social History Main Topics  . Smoking status: Former Smoker    Quit date: 01/08/2005  . Smokeless tobacco: Never Used  . Alcohol Use: No  . Drug Use: No  . Sexual Activity: Yes    Birth Control/ Protection: None   Other Topics Concern  . Not on file   Social History Narrative   Family History  Problem Relation Age of Onset  . Arthritis Mother   . COPD Mother   . Diabetes Mother   . Diabetes Father   . Heart disease Father   . Vision loss Father   . Heart disease Sister   . Hypertension Sister   . Vision loss Sister   . Hypertension Brother   .  Cancer Maternal Grandmother   . Diabetes Maternal Grandmother   . Diabetes Maternal Grandfather   . Cancer Paternal Grandmother   . Diabetes Paternal Grandmother   . Diabetes Paternal Grandfather      Review of Systems  All other systems reviewed and are negative.      Objective:   Physical Exam  Constitutional: She appears well-developed and well-nourished. No distress.  Eyes: Left eye exhibits no discharge.  Cardiovascular: Normal rate, regular rhythm and normal heart sounds.  Exam reveals no gallop and no friction rub.   No murmur heard. Pulmonary/Chest: Effort normal and breath sounds normal. No respiratory distress. She has no wheezes. She has no rales.  Musculoskeletal: She exhibits no edema.       Left knee: She exhibits abnormal meniscus. She exhibits normal range of motion, no swelling, no effusion, no erythema, no LCL laxity, normal  patellar mobility and no MCL laxity. No medial joint line, no lateral joint line, no MCL and no LCL tenderness noted.  Skin: She is not diaphoretic.  Vitals reviewed.         Assessment & Plan:  Left knee pain  As stated before, I feel the patient has a Baker's cyst secondary to a left meniscal tear. However the patient is doing much better. At the present time there is no indication to proceed with an MRI or a cortisone injection. I recommended discontinuation of Toradol and switching to meloxicam 15 mg by mouth daily when necessary knee pain. Recheck if worsening or if not completely better in 1 month

## 2015-08-21 ENCOUNTER — Telehealth: Payer: Self-pay | Admitting: Family Medicine

## 2015-08-21 MED ORDER — POTASSIUM CHLORIDE CRYS ER 10 MEQ PO TBCR: 10.0000 meq | EXTENDED_RELEASE_TABLET | Freq: Two times a day (BID) | ORAL | Status: DC

## 2015-08-21 NOTE — Telephone Encounter (Signed)
potassium chloride (K-DUR,KLOR-CON) 10 MEQ tablet [facility] [address]  Please call her at [phone]

## 2015-08-21 NOTE — Telephone Encounter (Signed)
Medication called/sent to requested pharmacy  

## 2015-09-07 ENCOUNTER — Ambulatory Visit (INDEPENDENT_AMBULATORY_CARE_PROVIDER_SITE_OTHER): Payer: Commercial Managed Care - HMO | Admitting: Family Medicine

## 2015-09-07 ENCOUNTER — Encounter: Payer: Self-pay | Admitting: Family Medicine

## 2015-09-07 VITALS — BP 140/86 | HR 74 | Temp 98.3°F | Resp 14 | Ht 62.5 in | Wt 191.0 lb

## 2015-09-07 DIAGNOSIS — J019 Acute sinusitis, unspecified: Secondary | ICD-10-CM

## 2015-09-07 MED ORDER — AMOXICILLIN 500 MG PO CAPS: 500.0000 mg | ORAL_CAPSULE | Freq: Three times a day (TID) | ORAL | Status: DC

## 2015-09-07 NOTE — Progress Notes (Signed)
Subjective:    Patient ID: Carolyn Noble, female    DOB: 09/10/1965, 49 y.o.   MRN: [id number]  HPI Symptoms began more than 7 days ago. She now has pain in both maxillary sinuses and both frontal sinuses. She reports epistaxis and purulent nasal discharge. She reports headache and subjective fevers. She reports postnasal drip. She is also reporting some tenderness in the lower ribs bilaterally in the front. This is worse with coughing and deep inspiration. She denies any hemoptysis or shortness of breath. Past Medical History  Diagnosis Date  . Anemia   . Allergy     SEASONAL  . Hypertension   . Blind in both eyes     Stargartz   No past surgical history on file. Current Outpatient Prescriptions on File Prior to Visit  Medication Sig Dispense Refill  . cholecalciferol (VITAMIN D) 1000 UNITS tablet Take 1,000 Units by mouth daily.    Marland Kitchen. ketorolac (TORADOL) 10 MG tablet Take 1 tablet (10 mg total) by mouth every 6 (six) hours as needed. 20 tablet 0  . lisinopril (PRINIVIL,ZESTRIL) 5 MG tablet TAKE 1 TABLET BY MOUTH EVERY DAY 90 tablet 3  . loratadine (CLARITIN) 10 MG tablet Take 10 mg by mouth daily.    . meloxicam (MOBIC) 15 MG tablet Take 15 mg by mouth daily.    Marland Kitchen. oxyCODONE-acetaminophen (PERCOCET/ROXICET) 5-325 MG per tablet 1 tablet every 6 (six) hours as needed.   0  . potassium chloride (K-DUR,KLOR-CON) 10 MEQ tablet Take 1 tablet (10 mEq total) by mouth 2 (two) times daily. 60 tablet 5  . vitamin B-12 (CYANOCOBALAMIN) 100 MCG tablet Take 100 mcg by mouth daily.    Marland Kitchen. zinc sulfate 220 MG capsule Take 220 mg by mouth daily.     No current facility-administered medications on file prior to visit.   Allergies  Allergen Reactions  . Hydrocodone     INTOLERANCE- UPSET STOMACH   Social History   Social History  . Marital Status: Divorced    Spouse Name: N/A  . Number of Children: N/A  . Years of Education: N/A   Occupational History  . Not on file.   Social History Main  Topics  . Smoking status: Former Smoker    Quit date: 01/08/2005  . Smokeless tobacco: Never Used  . Alcohol Use: No  . Drug Use: No  . Sexual Activity: Yes    Birth Control/ Protection: None   Other Topics Concern  . Not on file   Social History Narrative      Review of Systems  All other systems reviewed and are negative.      Objective:   Physical Exam  Constitutional: She appears well-developed and well-nourished.  HENT:  Right Ear: External ear normal.  Left Ear: External ear normal.  Nose: Mucosal edema and rhinorrhea present. Right sinus exhibits maxillary sinus tenderness and frontal sinus tenderness. Left sinus exhibits maxillary sinus tenderness and frontal sinus tenderness.  Mouth/Throat: Oropharynx is clear and moist. No oropharyngeal exudate.  Eyes: Conjunctivae are normal.  Neck: Neck supple.  Cardiovascular: Normal rate, regular rhythm and normal heart sounds.   Pulmonary/Chest: Effort normal and breath sounds normal. No respiratory distress. She has no wheezes. She has no rales. She exhibits tenderness.  Lymphadenopathy:    She has no cervical adenopathy.  Vitals reviewed.         Assessment & Plan:  Acute rhinosinusitis - Plan: amoxicillin (AMOXIL) 500 MG capsule  Begin amoxicillin 500 mg by mouth  3 times a day for 10 days. I believe the patient may have strained an intercostal muscle bilaterally with coughing and sneezing. I recommended tincture of time. Recheck immediately if symptoms worsen. Chest pain should improve over the next  7-14 days. If not let me know immediately.

## 2015-11-01 ENCOUNTER — Encounter: Payer: Self-pay | Admitting: *Deleted

## 2015-11-01 DIAGNOSIS — H547 Unspecified visual loss: Secondary | ICD-10-CM | POA: Insufficient documentation

## 2016-01-12 ENCOUNTER — Telehealth: Payer: Self-pay | Admitting: Family Medicine

## 2016-01-12 MED ORDER — MELOXICAM 15 MG PO TABS: 15.0000 mg | ORAL_TABLET | Freq: Every day | ORAL | Status: DC

## 2016-01-12 NOTE — Telephone Encounter (Signed)
Medication called/sent to requested pharmacy  

## 2016-01-12 NOTE — Telephone Encounter (Signed)
Pt is requesting a refill of Meloxicam 15 mg. [facility]

## 2016-01-22 ENCOUNTER — Telehealth: Payer: Self-pay | Admitting: Family Medicine

## 2016-01-22 DIAGNOSIS — H548 Legal blindness, as defined in USA: Secondary | ICD-10-CM

## 2016-01-22 NOTE — Telephone Encounter (Signed)
Patient would like referral to [facility] if possible  [phone]

## 2016-01-24 NOTE — Telephone Encounter (Signed)
ok 

## 2016-01-24 NOTE — Telephone Encounter (Signed)
Ok to refer.

## 2016-01-25 NOTE — Telephone Encounter (Signed)
Referral orders placed

## 2016-02-12 ENCOUNTER — Telehealth: Payer: Self-pay | Admitting: Family Medicine

## 2016-02-12 NOTE — Telephone Encounter (Signed)
Received fax from [facility] Auth# [id number] Treating Provider Jethro BolusMark [facility] # of Visits 6 Start Date 03/26/16 End Date 09/22/16 CPT 99499 DX H54.8 and Z01.89

## 2016-02-13 ENCOUNTER — Other Ambulatory Visit: Payer: Self-pay

## 2016-02-13 MED ORDER — MELOXICAM 15 MG PO TABS: 15.0000 mg | ORAL_TABLET | Freq: Every day | ORAL | Status: DC

## 2016-02-13 MED ORDER — POTASSIUM CHLORIDE CRYS ER 10 MEQ PO TBCR: 10.0000 meq | EXTENDED_RELEASE_TABLET | Freq: Two times a day (BID) | ORAL | Status: DC

## 2016-02-20 MED ORDER — POTASSIUM CHLORIDE CRYS ER 10 MEQ PO TBCR: 10.0000 meq | EXTENDED_RELEASE_TABLET | Freq: Two times a day (BID) | ORAL | Status: DC

## 2016-02-20 MED ORDER — MELOXICAM 15 MG PO TABS: 15.0000 mg | ORAL_TABLET | Freq: Every day | ORAL | Status: DC

## 2016-02-20 NOTE — Addendum Note (Signed)
Addended by: Legrand RamsWILLIS, Courtlynn Holloman B on: 02/20/2016 08:53 AM   Modules accepted: Orders

## 2016-04-24 ENCOUNTER — Telehealth: Payer: Self-pay | Admitting: Family Medicine

## 2016-04-24 DIAGNOSIS — Z1211 Encounter for screening for malignant neoplasm of colon: Secondary | ICD-10-CM

## 2016-04-24 NOTE — Telephone Encounter (Signed)
Patient is calling to request a referral for colonscopy  If possible   [phone]

## 2016-05-06 NOTE — Telephone Encounter (Signed)
Referral placed.

## 2016-06-25 ENCOUNTER — Telehealth: Payer: Self-pay | Admitting: Family Medicine

## 2016-06-25 NOTE — Telephone Encounter (Signed)
Pt has not been seen since 08/2015 and needs an appt. LMOVM that she needs an appt and to call us back to schedule that appt.

## 2016-06-25 NOTE — Telephone Encounter (Signed)
Patient states she left vm on [facility] vm yesterday requesting a prescription [facility] [address] Please notify patient once this has been called in or if she needs an appt.  CB# [phone]

## 2016-09-18 ENCOUNTER — Other Ambulatory Visit: Payer: Self-pay | Admitting: Family Medicine

## 2016-11-21 ENCOUNTER — Ambulatory Visit (INDEPENDENT_AMBULATORY_CARE_PROVIDER_SITE_OTHER): Payer: Medicare HMO | Admitting: Family Medicine

## 2016-11-21 ENCOUNTER — Other Ambulatory Visit: Payer: Self-pay | Admitting: Family Medicine

## 2016-11-21 ENCOUNTER — Encounter: Payer: Self-pay | Admitting: Family Medicine

## 2016-11-21 VITALS — BP 134/78 | HR 70 | Temp 98.7°F | Resp 16 | Ht 62.5 in | Wt 166.0 lb

## 2016-11-21 DIAGNOSIS — H548 Legal blindness, as defined in USA: Secondary | ICD-10-CM

## 2016-11-21 DIAGNOSIS — Z0000 Encounter for general adult medical examination without abnormal findings: Secondary | ICD-10-CM

## 2016-11-21 DIAGNOSIS — Z124 Encounter for screening for malignant neoplasm of cervix: Secondary | ICD-10-CM | POA: Diagnosis not present

## 2016-11-21 DIAGNOSIS — I10 Essential (primary) hypertension: Secondary | ICD-10-CM

## 2016-11-21 DIAGNOSIS — R87612 Low grade squamous intraepithelial lesion on cytologic smear of cervix (LGSIL): Secondary | ICD-10-CM | POA: Diagnosis not present

## 2016-11-21 LAB — CBC WITH DIFFERENTIAL/PLATELET
Basophils Absolute: 66 cells/uL (ref 0–200)
Basophils Relative: 1 %
EOS ABS: 66 {cells}/uL (ref 15–500)
Eosinophils Relative: 1 %
HCT: 39 % (ref 35.0–45.0)
Hemoglobin: 12.9 g/dL (ref 12.0–15.0)
LYMPHS ABS: 1584 {cells}/uL (ref 850–3900)
Lymphocytes Relative: 24 %
MCH: 29.7 pg (ref 27.0–33.0)
MCHC: 33.1 g/dL (ref 32.0–36.0)
MCV: 89.9 fL (ref 80.0–100.0)
MPV: 11.1 fL (ref 7.5–12.5)
Monocytes Absolute: 594 cells/uL (ref 200–950)
Monocytes Relative: 9 %
NEUTROS PCT: 65 %
Neutro Abs: 4290 cells/uL (ref 1500–7800)
Platelets: 201 10*3/uL (ref 140–400)
RBC: 4.34 MIL/uL (ref 3.80–5.10)
RDW: 13.8 % (ref 11.0–15.0)
WBC: 6.6 10*3/uL (ref 3.8–10.8)

## 2016-11-21 LAB — COMPLETE METABOLIC PANEL WITH GFR
ALT: 16 U/L (ref 6–29)
AST: 15 U/L (ref 10–35)
Albumin: 4.2 g/dL (ref 3.6–5.1)
Alkaline Phosphatase: 62 U/L (ref 33–130)
BUN: 23 mg/dL (ref 7–25)
CALCIUM: 9.4 mg/dL (ref 8.6–10.4)
CO2: 26 mmol/L (ref 20–31)
Chloride: 108 mmol/L (ref 98–110)
Creat: 1.02 mg/dL (ref 0.50–1.05)
GFR, Est African American: 73 mL/min (ref >=60)
GFR, Est Non African American: 63 mL/min (ref >=60)
Glucose, Bld: 75 mg/dL (ref 70–99)
Potassium: 4.1 mmol/L (ref 3.5–5.3)
Sodium: 141 mmol/L (ref 135–146)
Total Bilirubin: 0.5 mg/dL (ref 0.2–1.2)
Total Protein: 6.6 g/dL (ref 6.1–8.1)

## 2016-11-21 LAB — LIPID PANEL
CHOLESTEROL: 196 mg/dL (ref <200)
HDL: 58 mg/dL (ref >50)
LDL Cholesterol: 110 mg/dL — ABNORMAL HIGH (ref <100)
Total CHOL/HDL Ratio: 3.4 Ratio (ref <5.0)
Triglycerides: 142 mg/dL (ref <150)
VLDL: 28 mg/dL (ref <30)

## 2016-11-21 NOTE — Progress Notes (Signed)
Subjective:    Patient ID: Carolyn Noble, female    DOB: May 26, 1966, 51 y.o.   MRN: [id number]  HPI Patient is here today for CPE.  She is overdue for mammogram. She is due today for a Pap smear. She is also overdue for colonoscopy. The patient has lost almost 30 pounds since her last visit. She has been dieting and exercising and eating healthy. I'm extremely proud of the patient for this. However she is also about to move to [place] next week. She is in a new relationship she is very happy. Therefore she would like to defer her mammogram and colonoscopy until she is able to establish care in [place]. However she would like to have a Pap smear performed today. She declines a flu shot. She declines a tetanus shot.  Blood pressure is very well controlled now at 134/78.  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 Allergy    SEASONAL  . Anemia   . Blind in both eyes    [place]artz  . Hypertension    History reviewed. No pertinent surgical history. Current Outpatient Prescriptions on File Prior to Visit  Medication Sig Dispense Refill  . KLOR-CON M10 10 MEQ tablet TAKE 1 TABLET (10 MEQ TOTAL) BY MOUTH 2 (TWO) TIMES DAILY. 180 tablet 1  . lisinopril (PRINIVIL,ZESTRIL) 5 MG tablet TAKE 1 TABLET BY MOUTH EVERY DAY 90 tablet 3  . loratadine (CLARITIN) 10 MG tablet Take 10 mg by mouth daily.    . meloxicam (MOBIC) 15 MG tablet Take 1 tablet (15 mg total) by mouth daily. 90 tablet 0  . vitamin B-12 (CYANOCOBALAMIN) 100 MCG tablet Take 100 mcg by mouth daily.     No current facility-administered medications on file prior to visit.    Allergies  Allergen Reactions  . Hydrocodone     INTOLERANCE- UPSET STOMACH   Social History   Social History  . Marital status: Divorced    Spouse name: N/A  . Number of children: N/A  . Years of education: N/A   Occupational History  . Not on file.   Social  History Main Topics  . Smoking status: Former Smoker    Quit date: 01/08/2005  . Smokeless tobacco: Never Used  . Alcohol use No  . Drug use: No  . Sexual activity: Yes    Birth control/ protection: None   Other Topics Concern  . Not on file   Social History Narrative  . No narrative on file   Family History  Problem Relation Age of Onset  . Arthritis Mother   . COPD Mother   . Diabetes Mother   . Diabetes Father   . Heart disease Father   . Vision loss Father   . Heart disease Sister   . Hypertension Sister   . Vision loss Sister   . Hypertension Brother   . Cancer Maternal Grandmother   . Diabetes Maternal Grandmother   . Diabetes Maternal Grandfather   . Cancer Paternal Grandmother   . Diabetes Paternal Grandmother   . Diabetes Paternal Grandfather      Review of Systems  Skin: Positive for rash.  All other systems reviewed and are negative.      Objective:   Physical Exam  Constitutional: She is oriented to person, place, and time. She appears well-developed and well-nourished. No distress.  HENT:  Head: Normocephalic and atraumatic.  Right Ear:  External ear normal.  Left Ear: External ear normal.  Nose: Nose normal.  Mouth/Throat: Oropharynx is clear and moist. No oropharyngeal exudate.  Eyes: Conjunctivae and EOM are normal. Pupils are equal, round, and reactive to light. Right eye exhibits no discharge. Left eye exhibits no discharge. No scleral icterus.  Neck: Normal range of motion. Neck supple. No JVD present. No tracheal deviation present. No thyromegaly present.  Cardiovascular: Normal rate, regular rhythm, normal heart sounds and intact distal pulses.  Exam reveals no gallop and no friction rub.   No murmur heard. Pulmonary/Chest: Effort normal and breath sounds normal. No stridor. No respiratory distress. She has no wheezes. She has no rales. She exhibits no tenderness.  Abdominal: Soft. Bowel sounds are normal. She exhibits no distension and no  mass. There is no tenderness. There is no rebound and no guarding.  Musculoskeletal: Normal range of motion. She exhibits no edema or tenderness.  Lymphadenopathy:    She has no cervical adenopathy.  Neurological: She is alert and oriented to person, place, and time. She has normal reflexes. No cranial nerve deficit. She exhibits normal muscle tone. Coordination normal.  Skin: Skin is warm. No rash noted. She is not diaphoretic. No erythema. No pallor.  Psychiatric: She has a normal mood and affect. Her behavior is normal. Judgment and thought content normal.  Vitals reviewed.         Assessment & Plan:  General medical exam - Plan: CBC with Differential/Platelet, COMPLETE METABOLIC PANEL WITH GFR, Lipid panel, PAP, Thin Prep w/HPV rflx HPV Type 16/18  Benign essential HTN  Legal blindness  Cervical cancer screening - Plan: PAP, Thin Prep w/HPV rflx HPV Type 16/18  I congartulated the patient on her weight loss. Her blood pressures well controlled. I performed a Pap smear. The remainder of her exam is completely normal. Strongly recommended a mammogram and colonoscopy but the patient would like to defer that until she establishes care in [place]. I will check a CBC, CMP, and a fasting lipid panel. Offered hepatitis C and HIV screening but the patient declines this politely but she does not believe she has the risk factors

## 2016-11-25 ENCOUNTER — Encounter: Payer: Self-pay | Admitting: Family Medicine

## 2016-11-26 LAB — PAP, THIN PREP W/HPV RFLX HPV TYPE 16/18: HPV DNA High Risk: NOT DETECTED

## 2017-07-23 ENCOUNTER — Other Ambulatory Visit: Payer: Self-pay | Admitting: Family Medicine

## 2017-10-18 ENCOUNTER — Other Ambulatory Visit: Payer: Self-pay | Admitting: Family Medicine

## 2017-10-21 ENCOUNTER — Other Ambulatory Visit: Payer: Self-pay | Admitting: Family Medicine

## 2017-10-21 MED ORDER — LISINOPRIL 5 MG PO TABS: 5.0000 mg | ORAL_TABLET | Freq: Every day | ORAL | 0 refills | Status: DC

## 2017-10-21 MED ORDER — POTASSIUM CHLORIDE CRYS ER 10 MEQ PO TBCR: 10.0000 meq | EXTENDED_RELEASE_TABLET | Freq: Two times a day (BID) | ORAL | 0 refills | Status: AC

## 2017-10-21 NOTE — Addendum Note (Signed)
Addended by: Legrand RamsWILLIS, Taegan Haider B on: 10/21/2017 05:04 PM   Modules accepted: Orders

## 2018-01-16 ENCOUNTER — Other Ambulatory Visit: Payer: Self-pay | Admitting: Family Medicine
